# Patient Record
Sex: Male | Born: 1940 | Race: White | Hispanic: No | Marital: Married
Health system: Southern US, Community
[De-identification: ages and names within clinical notes are randomized; demographics above are authoritative.]

---

## 2008-02-21 ENCOUNTER — Encounter (INDEPENDENT_AMBULATORY_CARE_PROVIDER_SITE_OTHER): Payer: Self-pay | Admitting: Internal Medicine

## 2008-02-21 ENCOUNTER — Ambulatory Visit: Payer: Self-pay | Admitting: Vascular Surgery

## 2008-02-21 ENCOUNTER — Ambulatory Visit (HOSPITAL_COMMUNITY): Admission: RE | Admit: 2008-02-21 | Discharge: 2008-02-21 | Payer: Self-pay | Admitting: Internal Medicine

## 2009-06-27 ENCOUNTER — Encounter: Admission: RE | Admit: 2009-06-27 | Discharge: 2009-06-27 | Payer: Self-pay | Admitting: Otolaryngology

## 2013-06-27 ENCOUNTER — Telehealth: Payer: Self-pay | Admitting: Medical Oncology

## 2013-06-27 NOTE — Telephone Encounter (Signed)
erroneous

## 2016-12-15 ENCOUNTER — Other Ambulatory Visit: Payer: Self-pay | Admitting: Internal Medicine

## 2016-12-15 ENCOUNTER — Ambulatory Visit
Admission: RE | Admit: 2016-12-15 | Discharge: 2016-12-15 | Disposition: A | Discharge status: Home / Self Care | Payer: Medicare HMO | Source: Ambulatory Visit | Attending: Internal Medicine | Admitting: Internal Medicine

## 2016-12-15 DIAGNOSIS — R05 Cough: Secondary | ICD-10-CM

## 2016-12-15 DIAGNOSIS — R059 Cough, unspecified: Secondary | ICD-10-CM

## 2016-12-15 DIAGNOSIS — Z1389 Encounter for screening for other disorder: Secondary | ICD-10-CM | POA: Diagnosis not present

## 2016-12-15 DIAGNOSIS — Z0000 Encounter for general adult medical examination without abnormal findings: Secondary | ICD-10-CM | POA: Diagnosis not present

## 2016-12-15 DIAGNOSIS — E781 Pure hyperglyceridemia: Secondary | ICD-10-CM | POA: Diagnosis not present

## 2016-12-22 DIAGNOSIS — R05 Cough: Secondary | ICD-10-CM | POA: Diagnosis not present

## 2017-05-26 DIAGNOSIS — R69 Illness, unspecified: Secondary | ICD-10-CM | POA: Diagnosis not present

## 2017-06-16 DIAGNOSIS — R69 Illness, unspecified: Secondary | ICD-10-CM | POA: Diagnosis not present

## 2017-08-03 DIAGNOSIS — R69 Illness, unspecified: Secondary | ICD-10-CM | POA: Diagnosis not present

## 2017-11-12 DIAGNOSIS — R51 Headache: Secondary | ICD-10-CM | POA: Diagnosis not present

## 2017-11-12 DIAGNOSIS — S0100XA Unspecified open wound of scalp, initial encounter: Secondary | ICD-10-CM | POA: Diagnosis not present

## 2017-11-12 DIAGNOSIS — S0990XA Unspecified injury of head, initial encounter: Secondary | ICD-10-CM | POA: Diagnosis not present

## 2017-11-12 DIAGNOSIS — M542 Cervicalgia: Secondary | ICD-10-CM | POA: Diagnosis not present

## 2017-12-19 DIAGNOSIS — R5383 Other fatigue: Secondary | ICD-10-CM | POA: Diagnosis not present

## 2017-12-19 DIAGNOSIS — Z0000 Encounter for general adult medical examination without abnormal findings: Secondary | ICD-10-CM | POA: Diagnosis not present

## 2017-12-19 DIAGNOSIS — N529 Male erectile dysfunction, unspecified: Secondary | ICD-10-CM | POA: Diagnosis not present

## 2017-12-19 DIAGNOSIS — E781 Pure hyperglyceridemia: Secondary | ICD-10-CM | POA: Diagnosis not present

## 2017-12-19 DIAGNOSIS — Z1389 Encounter for screening for other disorder: Secondary | ICD-10-CM | POA: Diagnosis not present

## 2018-01-10 DIAGNOSIS — N5201 Erectile dysfunction due to arterial insufficiency: Secondary | ICD-10-CM | POA: Diagnosis not present

## 2018-01-10 DIAGNOSIS — N401 Enlarged prostate with lower urinary tract symptoms: Secondary | ICD-10-CM | POA: Diagnosis not present

## 2018-01-10 DIAGNOSIS — R3912 Poor urinary stream: Secondary | ICD-10-CM | POA: Diagnosis not present

## 2018-01-17 DIAGNOSIS — N5201 Erectile dysfunction due to arterial insufficiency: Secondary | ICD-10-CM | POA: Diagnosis not present

## 2018-03-10 DIAGNOSIS — Z1211 Encounter for screening for malignant neoplasm of colon: Secondary | ICD-10-CM | POA: Diagnosis not present

## 2018-03-10 DIAGNOSIS — K635 Polyp of colon: Secondary | ICD-10-CM | POA: Diagnosis not present

## 2018-03-10 DIAGNOSIS — K648 Other hemorrhoids: Secondary | ICD-10-CM | POA: Diagnosis not present

## 2018-03-15 DIAGNOSIS — Z1211 Encounter for screening for malignant neoplasm of colon: Secondary | ICD-10-CM | POA: Diagnosis not present

## 2018-03-15 DIAGNOSIS — K635 Polyp of colon: Secondary | ICD-10-CM | POA: Diagnosis not present

## 2018-05-29 DIAGNOSIS — R69 Illness, unspecified: Secondary | ICD-10-CM | POA: Diagnosis not present

## 2018-07-01 DIAGNOSIS — M455 Ankylosing spondylitis of thoracolumbar region: Secondary | ICD-10-CM | POA: Diagnosis not present

## 2018-07-20 DIAGNOSIS — H524 Presbyopia: Secondary | ICD-10-CM | POA: Diagnosis not present

## 2018-08-04 DIAGNOSIS — R69 Illness, unspecified: Secondary | ICD-10-CM | POA: Diagnosis not present

## 2019-01-12 DIAGNOSIS — R030 Elevated blood-pressure reading, without diagnosis of hypertension: Secondary | ICD-10-CM | POA: Diagnosis not present

## 2019-01-12 DIAGNOSIS — M542 Cervicalgia: Secondary | ICD-10-CM | POA: Diagnosis not present

## 2019-01-12 DIAGNOSIS — E781 Pure hyperglyceridemia: Secondary | ICD-10-CM | POA: Diagnosis not present

## 2019-01-12 DIAGNOSIS — I872 Venous insufficiency (chronic) (peripheral): Secondary | ICD-10-CM | POA: Diagnosis not present

## 2019-03-20 ENCOUNTER — Ambulatory Visit
Admission: RE | Admit: 2019-03-20 | Discharge: 2019-03-20 | Disposition: A | Discharge status: Home / Self Care | Payer: Medicare HMO | Source: Ambulatory Visit | Attending: Internal Medicine | Admitting: Internal Medicine

## 2019-03-20 ENCOUNTER — Other Ambulatory Visit: Payer: Self-pay | Admitting: Internal Medicine

## 2019-03-20 ENCOUNTER — Other Ambulatory Visit: Payer: Self-pay

## 2019-03-20 DIAGNOSIS — Z1389 Encounter for screening for other disorder: Secondary | ICD-10-CM | POA: Diagnosis not present

## 2019-03-20 DIAGNOSIS — I10 Essential (primary) hypertension: Secondary | ICD-10-CM | POA: Diagnosis not present

## 2019-03-20 DIAGNOSIS — M542 Cervicalgia: Secondary | ICD-10-CM | POA: Diagnosis not present

## 2019-03-20 DIAGNOSIS — Z5181 Encounter for therapeutic drug level monitoring: Secondary | ICD-10-CM | POA: Diagnosis not present

## 2019-03-20 DIAGNOSIS — Z0000 Encounter for general adult medical examination without abnormal findings: Secondary | ICD-10-CM | POA: Diagnosis not present

## 2019-03-20 DIAGNOSIS — I872 Venous insufficiency (chronic) (peripheral): Secondary | ICD-10-CM | POA: Diagnosis not present

## 2019-03-20 DIAGNOSIS — E781 Pure hyperglyceridemia: Secondary | ICD-10-CM | POA: Diagnosis not present

## 2019-04-17 DIAGNOSIS — I10 Essential (primary) hypertension: Secondary | ICD-10-CM | POA: Diagnosis not present

## 2019-05-28 DIAGNOSIS — R69 Illness, unspecified: Secondary | ICD-10-CM | POA: Diagnosis not present

## 2019-10-23 DIAGNOSIS — I10 Essential (primary) hypertension: Secondary | ICD-10-CM | POA: Diagnosis not present

## 2020-03-24 ENCOUNTER — Ambulatory Visit
Admission: RE | Admit: 2020-03-24 | Discharge: 2020-03-24 | Disposition: A | Discharge status: Home / Self Care | Payer: Medicare HMO | Source: Ambulatory Visit | Attending: Internal Medicine | Admitting: Internal Medicine

## 2020-03-24 ENCOUNTER — Other Ambulatory Visit: Payer: Self-pay | Admitting: Internal Medicine

## 2020-03-24 DIAGNOSIS — M79651 Pain in right thigh: Secondary | ICD-10-CM

## 2020-03-24 DIAGNOSIS — Z0000 Encounter for general adult medical examination without abnormal findings: Secondary | ICD-10-CM | POA: Diagnosis not present

## 2020-03-24 DIAGNOSIS — Z1159 Encounter for screening for other viral diseases: Secondary | ICD-10-CM | POA: Diagnosis not present

## 2020-03-24 DIAGNOSIS — E781 Pure hyperglyceridemia: Secondary | ICD-10-CM | POA: Diagnosis not present

## 2020-03-24 DIAGNOSIS — M1611 Unilateral primary osteoarthritis, right hip: Secondary | ICD-10-CM | POA: Diagnosis not present

## 2020-03-24 DIAGNOSIS — I10 Essential (primary) hypertension: Secondary | ICD-10-CM | POA: Diagnosis not present

## 2020-03-24 DIAGNOSIS — I872 Venous insufficiency (chronic) (peripheral): Secondary | ICD-10-CM | POA: Diagnosis not present

## 2020-03-24 DIAGNOSIS — Z1389 Encounter for screening for other disorder: Secondary | ICD-10-CM | POA: Diagnosis not present

## 2020-03-24 DIAGNOSIS — H9201 Otalgia, right ear: Secondary | ICD-10-CM | POA: Diagnosis not present

## 2020-03-24 DIAGNOSIS — H6123 Impacted cerumen, bilateral: Secondary | ICD-10-CM | POA: Diagnosis not present

## 2020-04-14 DIAGNOSIS — M25551 Pain in right hip: Secondary | ICD-10-CM | POA: Diagnosis not present

## 2020-04-14 DIAGNOSIS — M545 Low back pain: Secondary | ICD-10-CM | POA: Diagnosis not present

## 2020-04-14 DIAGNOSIS — M1611 Unilateral primary osteoarthritis, right hip: Secondary | ICD-10-CM | POA: Diagnosis not present

## 2020-05-07 DIAGNOSIS — E781 Pure hyperglyceridemia: Secondary | ICD-10-CM | POA: Diagnosis not present

## 2020-05-15 DIAGNOSIS — M1611 Unilateral primary osteoarthritis, right hip: Secondary | ICD-10-CM | POA: Diagnosis not present

## 2020-05-15 DIAGNOSIS — M25551 Pain in right hip: Secondary | ICD-10-CM | POA: Diagnosis not present

## 2020-05-22 DIAGNOSIS — M25859 Other specified joint disorders, unspecified hip: Secondary | ICD-10-CM | POA: Diagnosis not present

## 2020-05-22 DIAGNOSIS — Z01818 Encounter for other preprocedural examination: Secondary | ICD-10-CM | POA: Diagnosis not present

## 2020-06-03 DIAGNOSIS — M1611 Unilateral primary osteoarthritis, right hip: Secondary | ICD-10-CM | POA: Diagnosis not present

## 2020-06-05 DIAGNOSIS — M1611 Unilateral primary osteoarthritis, right hip: Secondary | ICD-10-CM | POA: Diagnosis not present

## 2020-06-05 DIAGNOSIS — M25551 Pain in right hip: Secondary | ICD-10-CM | POA: Diagnosis not present

## 2020-06-11 DIAGNOSIS — M1611 Unilateral primary osteoarthritis, right hip: Secondary | ICD-10-CM | POA: Diagnosis not present

## 2020-06-13 DIAGNOSIS — Z96641 Presence of right artificial hip joint: Secondary | ICD-10-CM | POA: Diagnosis not present

## 2020-06-13 DIAGNOSIS — M1611 Unilateral primary osteoarthritis, right hip: Secondary | ICD-10-CM | POA: Diagnosis not present

## 2020-06-18 DIAGNOSIS — M1611 Unilateral primary osteoarthritis, right hip: Secondary | ICD-10-CM | POA: Diagnosis not present

## 2020-06-18 DIAGNOSIS — Z96641 Presence of right artificial hip joint: Secondary | ICD-10-CM | POA: Diagnosis not present

## 2020-06-24 DIAGNOSIS — Z471 Aftercare following joint replacement surgery: Secondary | ICD-10-CM | POA: Diagnosis not present

## 2020-06-24 DIAGNOSIS — M1611 Unilateral primary osteoarthritis, right hip: Secondary | ICD-10-CM | POA: Diagnosis not present

## 2020-06-24 DIAGNOSIS — Z96641 Presence of right artificial hip joint: Secondary | ICD-10-CM | POA: Diagnosis not present

## 2020-06-26 DIAGNOSIS — Z96641 Presence of right artificial hip joint: Secondary | ICD-10-CM | POA: Diagnosis not present

## 2020-06-26 DIAGNOSIS — M1611 Unilateral primary osteoarthritis, right hip: Secondary | ICD-10-CM | POA: Diagnosis not present

## 2020-06-30 DIAGNOSIS — M1611 Unilateral primary osteoarthritis, right hip: Secondary | ICD-10-CM | POA: Diagnosis not present

## 2020-06-30 DIAGNOSIS — Z96641 Presence of right artificial hip joint: Secondary | ICD-10-CM | POA: Diagnosis not present

## 2020-07-07 DIAGNOSIS — Z96641 Presence of right artificial hip joint: Secondary | ICD-10-CM | POA: Diagnosis not present

## 2020-07-07 DIAGNOSIS — M1611 Unilateral primary osteoarthritis, right hip: Secondary | ICD-10-CM | POA: Diagnosis not present

## 2020-07-09 DIAGNOSIS — Z96641 Presence of right artificial hip joint: Secondary | ICD-10-CM | POA: Diagnosis not present

## 2020-07-09 DIAGNOSIS — M1611 Unilateral primary osteoarthritis, right hip: Secondary | ICD-10-CM | POA: Diagnosis not present

## 2020-07-16 DIAGNOSIS — M1611 Unilateral primary osteoarthritis, right hip: Secondary | ICD-10-CM | POA: Diagnosis not present

## 2020-07-16 DIAGNOSIS — Z96641 Presence of right artificial hip joint: Secondary | ICD-10-CM | POA: Diagnosis not present

## 2020-07-23 DIAGNOSIS — Z96641 Presence of right artificial hip joint: Secondary | ICD-10-CM | POA: Diagnosis not present

## 2020-07-23 DIAGNOSIS — M1611 Unilateral primary osteoarthritis, right hip: Secondary | ICD-10-CM | POA: Diagnosis not present

## 2020-07-31 DIAGNOSIS — Z96641 Presence of right artificial hip joint: Secondary | ICD-10-CM | POA: Diagnosis not present

## 2020-07-31 DIAGNOSIS — M1611 Unilateral primary osteoarthritis, right hip: Secondary | ICD-10-CM | POA: Diagnosis not present

## 2020-08-06 DIAGNOSIS — M1611 Unilateral primary osteoarthritis, right hip: Secondary | ICD-10-CM | POA: Diagnosis not present

## 2020-08-06 DIAGNOSIS — Z96641 Presence of right artificial hip joint: Secondary | ICD-10-CM | POA: Diagnosis not present

## 2020-08-12 DIAGNOSIS — R69 Illness, unspecified: Secondary | ICD-10-CM | POA: Diagnosis not present

## 2020-09-23 DIAGNOSIS — I10 Essential (primary) hypertension: Secondary | ICD-10-CM | POA: Diagnosis not present

## 2020-09-23 DIAGNOSIS — M1611 Unilateral primary osteoarthritis, right hip: Secondary | ICD-10-CM | POA: Diagnosis not present

## 2020-09-23 DIAGNOSIS — E781 Pure hyperglyceridemia: Secondary | ICD-10-CM | POA: Diagnosis not present

## 2020-10-21 DIAGNOSIS — Z96641 Presence of right artificial hip joint: Secondary | ICD-10-CM | POA: Diagnosis not present

## 2020-10-21 DIAGNOSIS — Z471 Aftercare following joint replacement surgery: Secondary | ICD-10-CM | POA: Diagnosis not present

## 2020-10-26 DIAGNOSIS — Z0100 Encounter for examination of eyes and vision without abnormal findings: Secondary | ICD-10-CM | POA: Diagnosis not present

## 2021-01-22 ENCOUNTER — Emergency Department (HOSPITAL_COMMUNITY)
Admission: EM | Admit: 2021-01-22 | Discharge: 2021-01-23 | Disposition: A | Discharge status: Home / Self Care | Payer: Medicare HMO | Attending: Emergency Medicine | Admitting: Emergency Medicine

## 2021-01-22 DIAGNOSIS — R109 Unspecified abdominal pain: Secondary | ICD-10-CM | POA: Diagnosis not present

## 2021-01-22 DIAGNOSIS — R112 Nausea with vomiting, unspecified: Secondary | ICD-10-CM

## 2021-01-22 DIAGNOSIS — R1030 Lower abdominal pain, unspecified: Secondary | ICD-10-CM | POA: Insufficient documentation

## 2021-01-22 DIAGNOSIS — R69 Illness, unspecified: Secondary | ICD-10-CM | POA: Diagnosis not present

## 2021-01-22 LAB — URINALYSIS, ROUTINE W REFLEX MICROSCOPIC
Bilirubin Urine: NEGATIVE
Glucose, UA: NEGATIVE mg/dL
Hgb urine dipstick: NEGATIVE
Ketones, ur: NEGATIVE mg/dL
Leukocytes,Ua: NEGATIVE
Nitrite: NEGATIVE
Protein, ur: NEGATIVE mg/dL
Specific Gravity, Urine: 1.021 (ref 1.005–1.030)
pH: 6 (ref 5.0–8.0)

## 2021-01-22 LAB — COMPREHENSIVE METABOLIC PANEL
ALT: 14 U/L (ref 0–44)
AST: 24 U/L (ref 15–41)
Albumin: 3.9 g/dL (ref 3.5–5.0)
Alkaline Phosphatase: 24 U/L — ABNORMAL LOW (ref 38–126)
Anion gap: 6 (ref 5–15)
BUN: 18 mg/dL (ref 8–23)
CO2: 27 mmol/L (ref 22–32)
Calcium: 9.1 mg/dL (ref 8.9–10.3)
Chloride: 103 mmol/L (ref 98–111)
Creatinine, Ser: 1.04 mg/dL (ref 0.61–1.24)
GFR, Estimated: >60 mL/min (ref >60)
Glucose, Bld: 132 mg/dL — ABNORMAL HIGH (ref 70–99)
Potassium: 3.7 mmol/L (ref 3.5–5.1)
Sodium: 136 mmol/L (ref 135–145)
Total Bilirubin: 0.9 mg/dL (ref 0.3–1.2)
Total Protein: 6.4 g/dL — ABNORMAL LOW (ref 6.5–8.1)

## 2021-01-22 LAB — CBC
HCT: 48.1 % (ref 39.0–52.0)
Hemoglobin: 16 g/dL (ref 13.0–17.0)
MCH: 29.4 pg (ref 26.0–34.0)
MCHC: 33.3 g/dL (ref 30.0–36.0)
MCV: 88.3 fL (ref 80.0–100.0)
Platelets: 216 10*3/uL (ref 150–400)
RBC: 5.45 MIL/uL (ref 4.22–5.81)
RDW: 13.7 % (ref 11.5–15.5)
WBC: 10.6 10*3/uL — ABNORMAL HIGH (ref 4.0–10.5)
nRBC: 0 % (ref 0.0–0.2)

## 2021-01-22 LAB — LIPASE, BLOOD: Lipase: 35 U/L (ref 11–51)

## 2021-01-22 MED ORDER — ONDANSETRON 4 MG PO TBDP
4.0000 mg | ORAL_TABLET | Freq: Once | ORAL | Status: AC | PRN
  Administered 2021-01-22: 4 mg via ORAL
  Filled 2021-01-22: qty 1

## 2021-01-22 NOTE — ED Triage Notes (Signed)
Pt reports lower abd pain + nausea/vomiting  and HTN, Pt states BP at home 190/85. Pt had 1 episode of vomiting in triage. Pt denies any fever, chills, chest pain

## 2021-01-22 NOTE — ED Provider Notes (Signed)
  Reginald Ramirez is a 80 y.o. male who presents c/o HTN, abd pain, N/V.  Denies fever, chills, lightheadedness.  HTN at home.  No abd surgeries.  No sick contacts. No treatments PTA.  NBNB emsis x2 PTA.    General: Awake, alert  HEENT: Atraumatic  Resp: Normal effort  Abd: Distended, TTP  MSK: Moves all extremities  Skin: Warm and dry  BP (!) 173/92 (BP Location: Left Arm)   Pulse 100   Temp 98.3 F (36.8 C) (Oral)   Resp 18   SpO2 100%   MSE was initiated and I personally evaluated the patient and placed orders (if any) at  10:11 PM on January 22, 2021.    The patient appears stable so that the remainder of the MSE may be completed by another provider.  Discussed with the patient that exiting the department prior to completion of the work-up is AMA and there is no guarantee that there are no emergency medical conditions present.     Jhan Conery, Boyd Kerbs 01/22/21 2213    Margarita Grizzle, MD 01/24/21 [phone]

## 2021-01-23 ENCOUNTER — Emergency Department (HOSPITAL_COMMUNITY): Payer: Medicare HMO

## 2021-01-23 DIAGNOSIS — R109 Unspecified abdominal pain: Secondary | ICD-10-CM | POA: Diagnosis not present

## 2021-01-23 MED ORDER — IOHEXOL 300 MG/ML  SOLN
100.0000 mL | Freq: Once | INTRAMUSCULAR | Status: AC | PRN
  Administered 2021-01-23: 100 mL via INTRAVENOUS

## 2021-01-23 NOTE — ED Notes (Signed)
Pt transported to CT ?

## 2021-01-23 NOTE — ED Provider Notes (Signed)
[facility] EMERGENCY DEPARTMENT Provider Note   CSN: [id number] Arrival date & time: 01/22/21  2201     History Chief Complaint  Patient presents with  . Abdominal Pain  . Emesis    Reginald Ramirez is a 80 y.o. male.  Patient without significant medical history presents with sudden onset pain across his lower abdomen around 7:30 tonight. He describes the pain as severe, nonradiating. His wife checked his blood pressure and found it to be elevated prompting ED evaluation. He has had 2 episodes NBNB emesis. No fever. He reports no symptoms during the day. No urinary symptoms, testicular pain, chest pain, SOB/DOE.   The history is provided by the patient. A language interpreter was used.  Abdominal Pain Associated symptoms: nausea and vomiting   Associated symptoms: no chest pain, no chills, no constipation, no diarrhea, no dysuria, no fever and no shortness of breath   Emesis Associated symptoms: abdominal pain   Associated symptoms: no chills, no diarrhea and no fever        No past medical history on file.  There are no problems to display for this patient.   No family history on file.     Home Medications Prior to Admission medications   Not on File    Allergies    Penicillins  Review of Systems   Review of Systems  Constitutional: Negative for chills and fever.  HENT: Negative.   Respiratory: Negative.  Negative for shortness of breath.   Cardiovascular: Negative.  Negative for chest pain.  Gastrointestinal: Positive for abdominal pain, nausea and vomiting. Negative for constipation and diarrhea.  Genitourinary: Negative.  Negative for dysuria, flank pain and testicular pain.  Musculoskeletal: Negative.  Negative for back pain.  Skin: Negative.   Neurological: Negative.  Negative for weakness and light-headedness.    Physical Exam Updated Vital Signs BP (!) 162/81   Pulse 76   Temp 98.3 F (36.8 C) (Oral)   Resp 18   SpO2 98%    Physical Exam Vitals and nursing note reviewed.  Constitutional:      General: He is not in acute distress.    Appearance: He is well-developed. He is not ill-appearing.  HENT:     Head: Normocephalic.  Cardiovascular:     Rate and Rhythm: Normal rate and regular rhythm.  Pulmonary:     Effort: Pulmonary effort is normal.     Breath sounds: Normal breath sounds.  Abdominal:     General: Bowel sounds are normal.     Palpations: Abdomen is soft.     Tenderness: There is no abdominal tenderness. There is no guarding or rebound.  Musculoskeletal:        General: Normal range of motion.     Cervical back: Normal range of motion and neck supple.  Skin:    General: Skin is warm and dry.     Findings: No rash.  Neurological:     Mental Status: He is alert and oriented to person, place, and time.     ED Results / Procedures / Treatments   Labs (all labs ordered are listed, but only abnormal results are displayed) Labs Reviewed  COMPREHENSIVE METABOLIC PANEL - Abnormal; Notable for the following components:      Result Value   Glucose, Bld 132 (*)    Total Protein 6.4 (*)    Alkaline Phosphatase 24 (*)    All other components within normal limits  CBC - Abnormal; Notable for the following components:  WBC 10.6 (*)    All other components within normal limits  URINALYSIS, ROUTINE W REFLEX MICROSCOPIC  LIPASE, BLOOD    EKG None  Radiology No results found.  Procedures Procedures   Medications Ordered in ED Medications  ondansetron (ZOFRAN-ODT) disintegrating tablet 4 mg (4 mg Oral Given 01/22/21 2213)    ED Course  I have reviewed the triage vital signs and the nursing notes.  Pertinent labs & imaging results that were available during my care of the patient were reviewed by me and considered in my medical decision making (see chart for details).    MDM Rules/Calculators/A&P                          Patient is 80 yo otherwise healthy gentleman presenting with  sudden onset abdominal pain, nausea, vomiting and elevated BP.   On arrival he was given Zofran and reports this completely resolved his nausea. He is no longer having any pain. Abdominal exam is benign. Blood pressure mildly elevated at 154/87.  Given age, hypertension, severe abdominal pain, will obtain CT for further evaluation.   CT results negative for acute pathology. Re-examination: patient remains asymptomatic. Abdomen nontender. No nausea, vomiting.   He is felt appropriate for discharge home. Return precautions discussed.   Final Clinical Impression(s) / ED Diagnoses Final diagnoses:  None   1. Abdominal pain 2. Nausea and vomiting  Rx / DC Orders ED Discharge Orders    None       Danne Harbor 01/23/21 0223    Pollyann Savoy, MD 01/23/21 0500

## 2021-01-23 NOTE — Discharge Instructions (Addendum)
Return to the emergency department with any new or concerning symptoms.  °

## 2021-01-23 NOTE — ED Notes (Signed)
Pt discharged and ambulated out of the ED without difficulty. 

## 2021-06-17 DIAGNOSIS — Z1389 Encounter for screening for other disorder: Secondary | ICD-10-CM | POA: Diagnosis not present

## 2021-06-17 DIAGNOSIS — Z23 Encounter for immunization: Secondary | ICD-10-CM | POA: Diagnosis not present

## 2021-06-17 DIAGNOSIS — I10 Essential (primary) hypertension: Secondary | ICD-10-CM | POA: Diagnosis not present

## 2021-06-17 DIAGNOSIS — E781 Pure hyperglyceridemia: Secondary | ICD-10-CM | POA: Diagnosis not present

## 2021-06-17 DIAGNOSIS — I872 Venous insufficiency (chronic) (peripheral): Secondary | ICD-10-CM | POA: Diagnosis not present

## 2021-06-17 DIAGNOSIS — M25562 Pain in left knee: Secondary | ICD-10-CM | POA: Diagnosis not present

## 2021-06-17 DIAGNOSIS — M25561 Pain in right knee: Secondary | ICD-10-CM | POA: Diagnosis not present

## 2021-06-17 DIAGNOSIS — Z0000 Encounter for general adult medical examination without abnormal findings: Secondary | ICD-10-CM | POA: Diagnosis not present

## 2021-11-19 IMAGING — CT CT ABD-PELV W/ CM
2 of 6 series · 16 of 46 positions shown, 18 images · IV contrast (Omni 300)
Comparison: None.

CLINICAL DATA: Abdominal pain. Abdominal abscess or infection is
suspected.

EXAM:
CT ABDOMEN AND PELVIS WITH CONTRAST
TECHNIQUE: Multidetector CT imaging of the abdomen and pelvis was performed
using the standard protocol following bolus administration of
intravenous contrast.
CONTRAST:  100mL OMNIPAQUE IOHEXOL 300 MG/ML  SOLN

[Series 3: a/p w/ 5mm · axial · 0.83mm/px · z∈[-332,+88]mm · 13 of 96 slices shown, 15 images]
[im 6/96  soft-tissue]
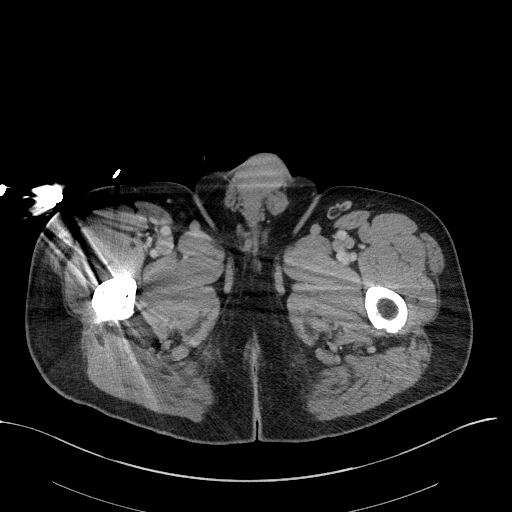
[im 6/96  bone]
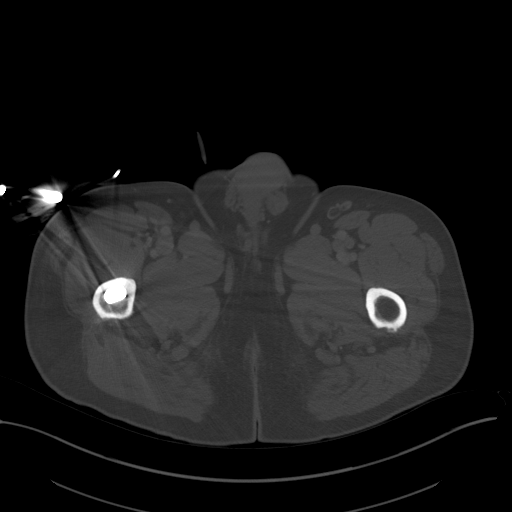
[im 12/96  soft-tissue]
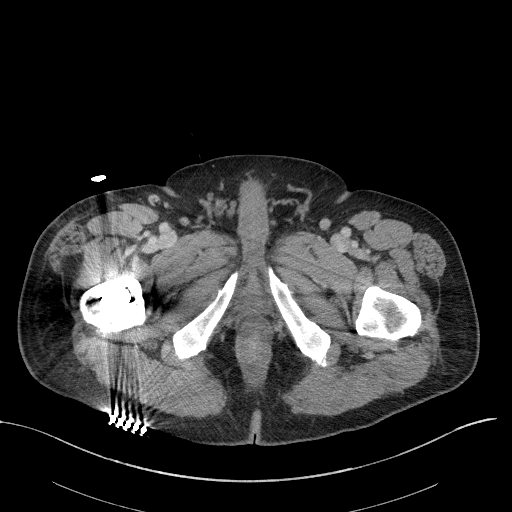
[im 18/96  soft-tissue]
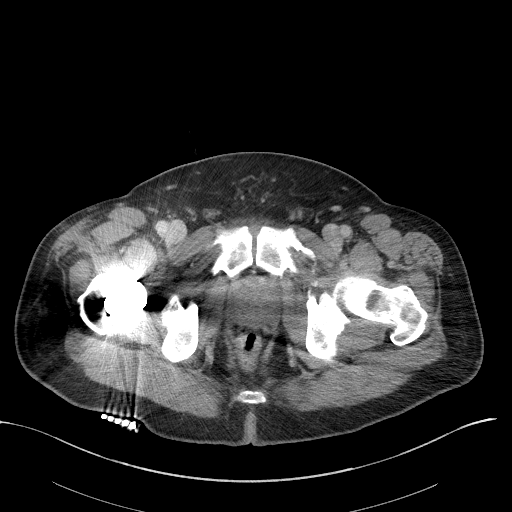
[im 30/96  soft-tissue]
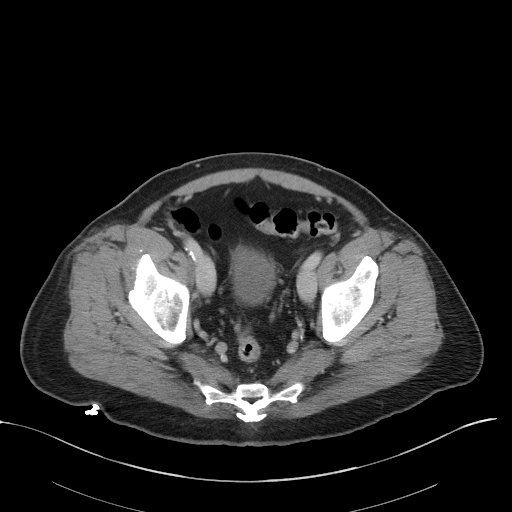
[im 36/96  soft-tissue]
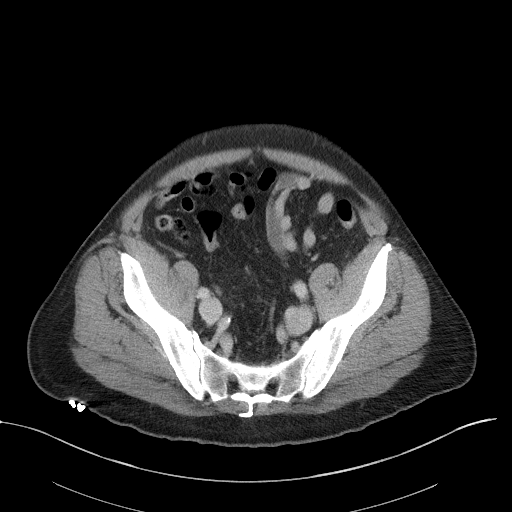
[im 42/96  soft-tissue]
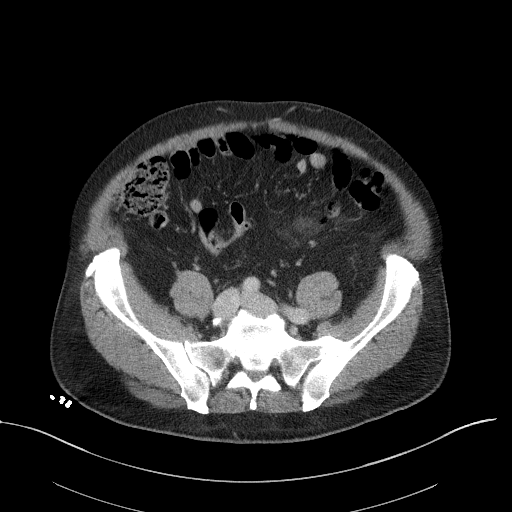
[im 48/96  soft-tissue]
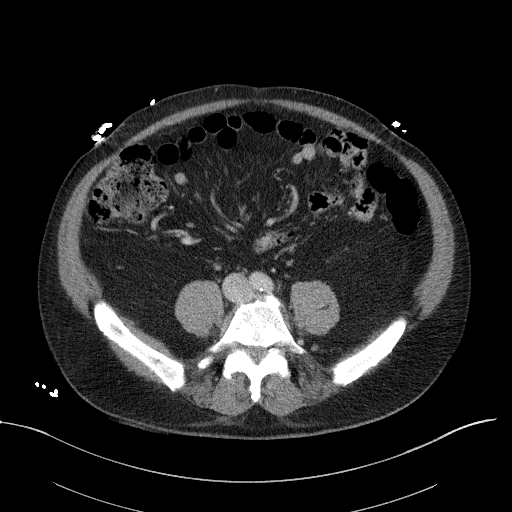
[im 54/96  soft-tissue]
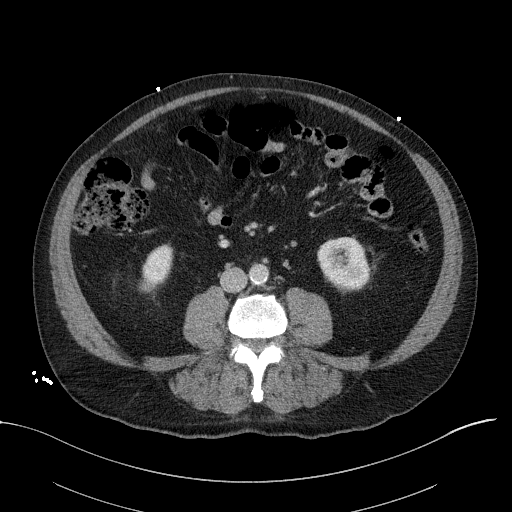
[im 60/96  soft-tissue]
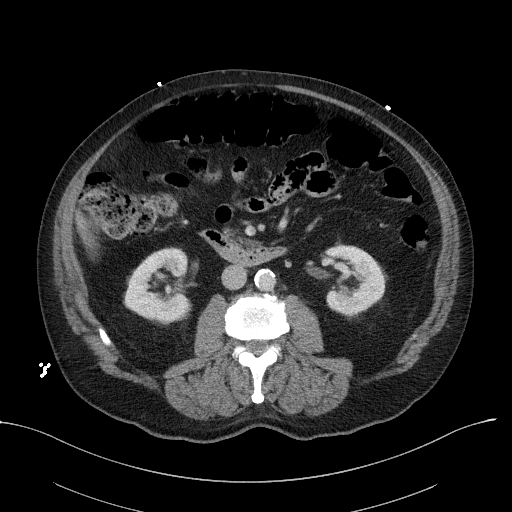
[im 60/96  bone]
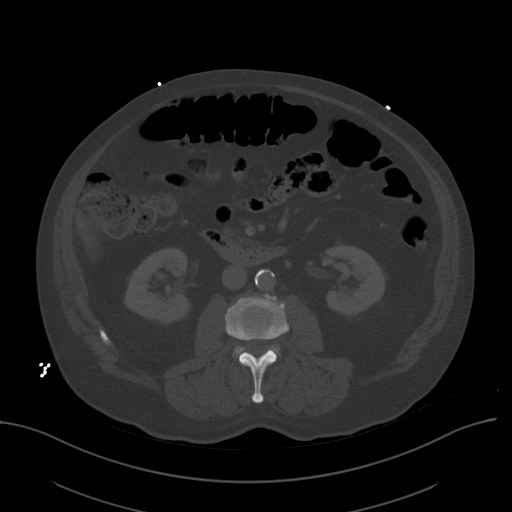
[im 66/96  soft-tissue]
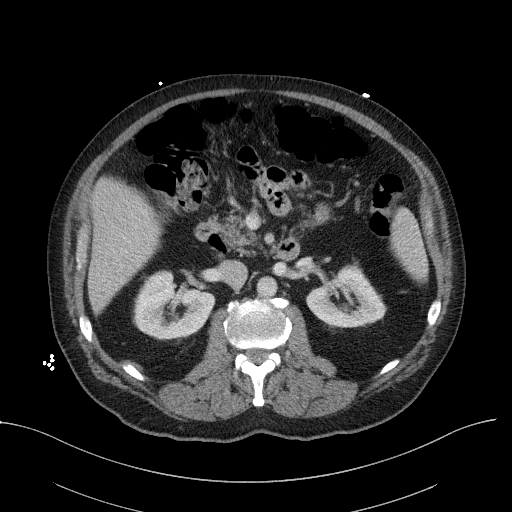
[im 78/96  soft-tissue]
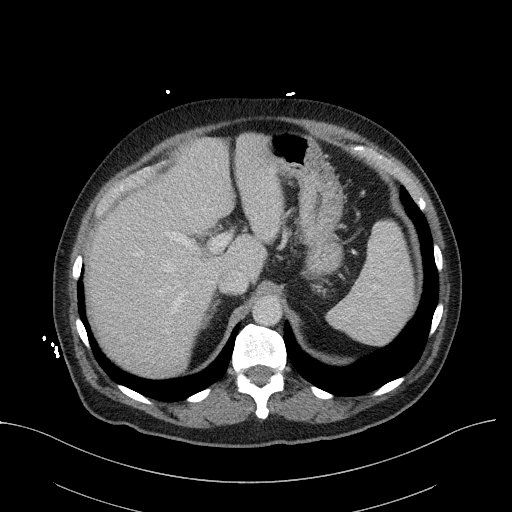
[im 84/96  soft-tissue]
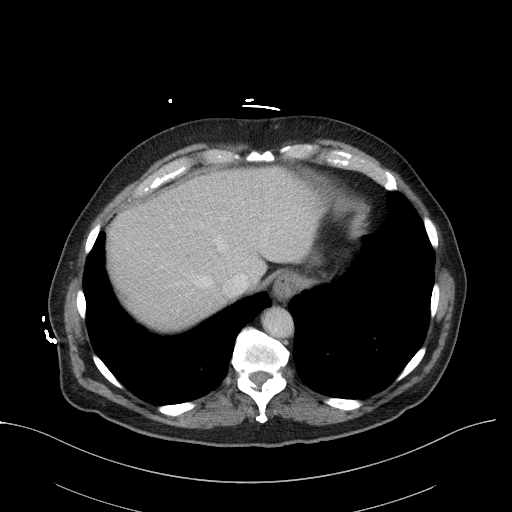
[im 90/96  soft-tissue]
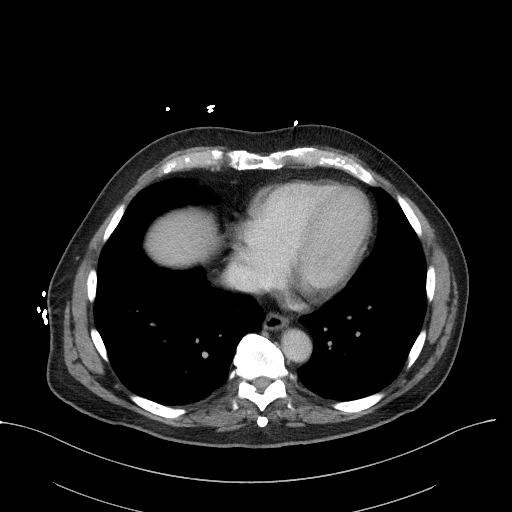

[Series 6: a/p w/ cor · coronal · 0.77mm/px · 3 of 151 slices shown]
[im 51/151  soft-tissue]
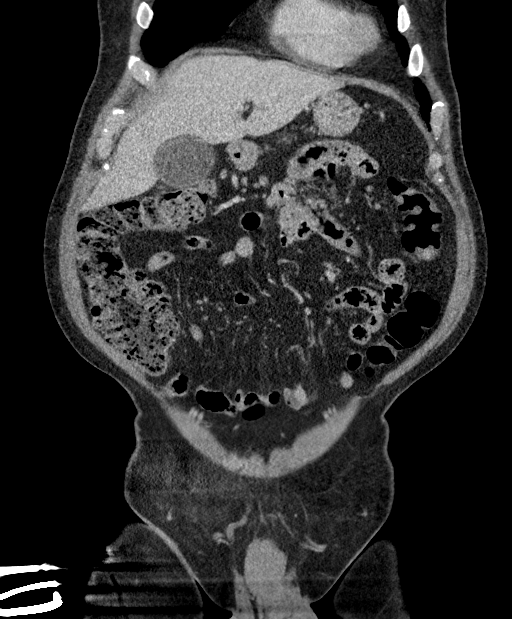
[im 67/151  soft-tissue]
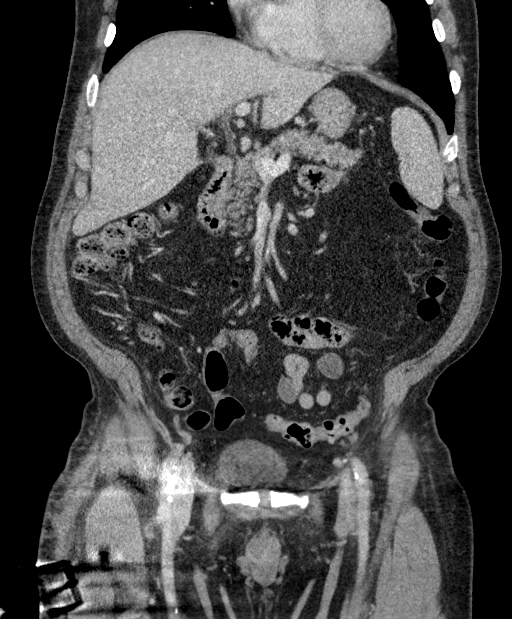
[im 84/151  soft-tissue]
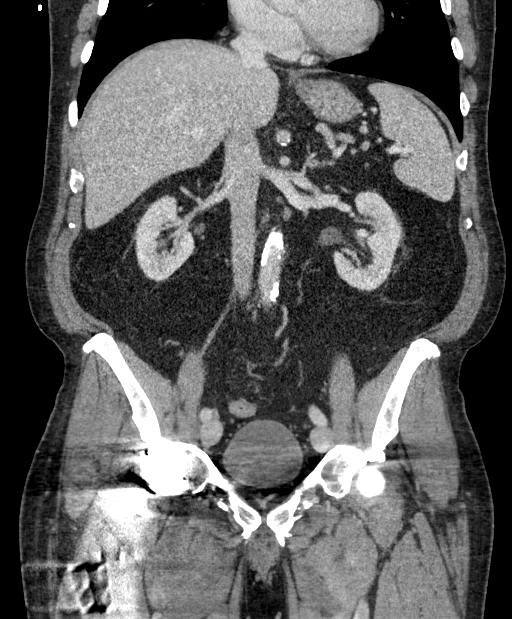

[16 of 46 positions shown; findings below may reference images not displayed]

FINDINGS: Lower chest: Lung bases are clear.

Hepatobiliary: No focal liver lesions. Portal veins are patent.
Gallbladder is mildly distended. No stones, wall thickening, or
inflammatory infiltration. No bile duct dilatation.

Pancreas: Unremarkable. No pancreatic ductal dilatation or
surrounding inflammatory changes.

Spleen: Normal in size without focal abnormality.

Adrenals/Urinary Tract: Adrenal glands are unremarkable. Kidneys are
normal, without renal calculi, focal lesion, or hydronephrosis.
Bladder is unremarkable.

Stomach/Bowel: Stomach, small bowel, and colon are not abnormally
distended. No wall thickening or inflammatory changes. Appendix is
not identified. Small duodenal diverticulum at the second portion.

Vascular/Lymphatic: Aortic atherosclerosis. No enlarged abdominal or
pelvic lymph nodes.

Reproductive: Prostate gland is enlarged, measuring 4.8 cm diameter.

Other: No free air or free fluid in the abdomen. No loculated fluid
collections. Abdominal wall musculature appears intact.

Musculoskeletal: Degenerative changes in the spine. No destructive
bone lesions. Previous right hip arthroplasty.
IMPRESSION: 1. No acute process demonstrated in the abdomen or pelvis. No
evidence of bowel obstruction or inflammation.
2. Enlarged prostate gland.
3. Aortic atherosclerosis.

Aortic Atherosclerosis ([id number].S).

## 2021-12-29 DIAGNOSIS — H524 Presbyopia: Secondary | ICD-10-CM | POA: Diagnosis not present

## 2021-12-29 DIAGNOSIS — Z0101 Encounter for examination of eyes and vision with abnormal findings: Secondary | ICD-10-CM | POA: Diagnosis not present

## 2022-01-15 DIAGNOSIS — H43821 Vitreomacular adhesion, right eye: Secondary | ICD-10-CM | POA: Diagnosis not present

## 2022-01-15 DIAGNOSIS — H35371 Puckering of macula, right eye: Secondary | ICD-10-CM | POA: Diagnosis not present

## 2022-01-15 DIAGNOSIS — H43812 Vitreous degeneration, left eye: Secondary | ICD-10-CM | POA: Diagnosis not present

## 2022-01-15 DIAGNOSIS — H43392 Other vitreous opacities, left eye: Secondary | ICD-10-CM | POA: Diagnosis not present

## 2022-03-01 DIAGNOSIS — M25561 Pain in right knee: Secondary | ICD-10-CM | POA: Diagnosis not present

## 2022-03-01 DIAGNOSIS — R600 Localized edema: Secondary | ICD-10-CM | POA: Diagnosis not present

## 2022-03-01 DIAGNOSIS — M25562 Pain in left knee: Secondary | ICD-10-CM | POA: Diagnosis not present

## 2022-03-01 DIAGNOSIS — Z96641 Presence of right artificial hip joint: Secondary | ICD-10-CM | POA: Diagnosis not present

## 2022-06-07 DIAGNOSIS — M25561 Pain in right knee: Secondary | ICD-10-CM | POA: Diagnosis not present

## 2022-06-07 DIAGNOSIS — M25562 Pain in left knee: Secondary | ICD-10-CM | POA: Diagnosis not present

## 2022-06-07 DIAGNOSIS — M170 Bilateral primary osteoarthritis of knee: Secondary | ICD-10-CM | POA: Diagnosis not present

## 2022-06-22 DIAGNOSIS — M1712 Unilateral primary osteoarthritis, left knee: Secondary | ICD-10-CM | POA: Diagnosis not present

## 2022-06-22 DIAGNOSIS — M1711 Unilateral primary osteoarthritis, right knee: Secondary | ICD-10-CM | POA: Diagnosis not present

## 2022-06-22 DIAGNOSIS — M170 Bilateral primary osteoarthritis of knee: Secondary | ICD-10-CM | POA: Diagnosis not present

## 2022-06-29 DIAGNOSIS — M1711 Unilateral primary osteoarthritis, right knee: Secondary | ICD-10-CM | POA: Diagnosis not present

## 2022-06-29 DIAGNOSIS — M1712 Unilateral primary osteoarthritis, left knee: Secondary | ICD-10-CM | POA: Diagnosis not present

## 2022-06-30 DIAGNOSIS — I10 Essential (primary) hypertension: Secondary | ICD-10-CM | POA: Diagnosis not present

## 2022-06-30 DIAGNOSIS — I872 Venous insufficiency (chronic) (peripheral): Secondary | ICD-10-CM | POA: Diagnosis not present

## 2022-06-30 DIAGNOSIS — M25561 Pain in right knee: Secondary | ICD-10-CM | POA: Diagnosis not present

## 2022-06-30 DIAGNOSIS — G8929 Other chronic pain: Secondary | ICD-10-CM | POA: Diagnosis not present

## 2022-06-30 DIAGNOSIS — Z1331 Encounter for screening for depression: Secondary | ICD-10-CM | POA: Diagnosis not present

## 2022-06-30 DIAGNOSIS — M25562 Pain in left knee: Secondary | ICD-10-CM | POA: Diagnosis not present

## 2022-06-30 DIAGNOSIS — E781 Pure hyperglyceridemia: Secondary | ICD-10-CM | POA: Diagnosis not present

## 2022-06-30 DIAGNOSIS — Z0000 Encounter for general adult medical examination without abnormal findings: Secondary | ICD-10-CM | POA: Diagnosis not present

## 2022-08-17 DIAGNOSIS — M25561 Pain in right knee: Secondary | ICD-10-CM | POA: Diagnosis not present

## 2022-08-17 DIAGNOSIS — M1712 Unilateral primary osteoarthritis, left knee: Secondary | ICD-10-CM | POA: Diagnosis not present

## 2022-08-17 DIAGNOSIS — M1711 Unilateral primary osteoarthritis, right knee: Secondary | ICD-10-CM | POA: Diagnosis not present

## 2022-08-17 DIAGNOSIS — M25562 Pain in left knee: Secondary | ICD-10-CM | POA: Diagnosis not present

## 2022-08-17 DIAGNOSIS — M170 Bilateral primary osteoarthritis of knee: Secondary | ICD-10-CM | POA: Diagnosis not present

## 2022-08-31 DIAGNOSIS — R053 Chronic cough: Secondary | ICD-10-CM | POA: Diagnosis not present

## 2022-08-31 DIAGNOSIS — E7800 Pure hypercholesterolemia, unspecified: Secondary | ICD-10-CM | POA: Diagnosis not present

## 2022-08-31 DIAGNOSIS — Z7689 Persons encountering health services in other specified circumstances: Secondary | ICD-10-CM | POA: Diagnosis not present

## 2022-08-31 DIAGNOSIS — I10 Essential (primary) hypertension: Secondary | ICD-10-CM | POA: Diagnosis not present

## 2022-08-31 DIAGNOSIS — H6691 Otitis media, unspecified, right ear: Secondary | ICD-10-CM | POA: Diagnosis not present

## 2022-08-31 DIAGNOSIS — Z23 Encounter for immunization: Secondary | ICD-10-CM | POA: Diagnosis not present

## 2022-09-21 DIAGNOSIS — I10 Essential (primary) hypertension: Secondary | ICD-10-CM | POA: Diagnosis not present

## 2022-09-21 DIAGNOSIS — Z23 Encounter for immunization: Secondary | ICD-10-CM | POA: Diagnosis not present

## 2022-09-21 DIAGNOSIS — H6991 Unspecified Eustachian tube disorder, right ear: Secondary | ICD-10-CM | POA: Diagnosis not present

## 2022-09-21 DIAGNOSIS — H6691 Otitis media, unspecified, right ear: Secondary | ICD-10-CM | POA: Diagnosis not present

## 2022-09-21 DIAGNOSIS — R052 Subacute cough: Secondary | ICD-10-CM | POA: Diagnosis not present
